# Patient Record
Sex: Female | Born: 2005 | Race: White | Hispanic: No | Marital: Single | Smoking: Never smoker
Health system: Southern US, Community
[De-identification: ages and names within clinical notes are randomized; demographics above are authoritative.]

## PROBLEM LIST (undated history)

## (undated) DIAGNOSIS — F419 Anxiety disorder, unspecified: Secondary | ICD-10-CM

---
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
## 2023-05-02 ENCOUNTER — Ambulatory Visit (HOSPITAL_COMMUNITY)
Admission: EM | Admit: 2023-05-02 | Discharge: 2023-05-02 | Disposition: A | Discharge status: Home / Self Care | Attending: Psychiatry | Admitting: Psychiatry

## 2023-05-02 DIAGNOSIS — F331 Major depressive disorder, recurrent, moderate: Secondary | ICD-10-CM

## 2023-05-02 MED ORDER — BUPROPION HCL ER (SR) 100 MG PO TB12: 100.0000 mg | ORAL_TABLET | Freq: Every day | ORAL | 0 refills | Status: DC

## 2023-05-02 MED ORDER — SERTRALINE HCL 100 MG PO TABS: 150.0000 mg | ORAL_TABLET | Freq: Every day | ORAL | Status: DC

## 2023-05-02 NOTE — ED Notes (Signed)
 Pt discharged and escorted out of facility by provider

## 2023-05-02 NOTE — Discharge Instructions (Addendum)
 It is imperative that you follow through with treatment recommendations within 5-7 days from the day of discharge to mitigate further risk to your safety and overall mental well-being.  A list of outpatient therapy and psychiatric providers for medication management has been provided below to get you started in finding the right provider for you.            [facility] [address] [place], [place], [address] [phone] phone (Medicare, Private insurance except Tricare, [place] Adwolf, and [facility])  [facility] [address] [place], [place], [address] [address] phone ([address], AmeriHealth Car[facility] - [place], [address], [place], [place], Friday Health Plans, [address], BCBS Healthy [place], [place], [address], [place], [place], [place], [place], Tricare, [facility], Safeco Corporation, Eli Lilly and Company)  [employer] [phone] [address], Suite [place], [place], [address] [phone] phone [phone] phone [phone] fax  [facility] [address] [place], [place], [address] [phone] phone (Call to confirm insurance coverage) Consultation & Support Services     o Drop-In Hours: 1:00 PM to 5:00 PM     o Days: Monday - Thursday  Crisis Services (24/7)   [facility] [address] [place], [place], [address] [phone] phone ([address] [place], [place], [place] Medicaid, [place] and [place], [facility])      [facility] [address] [place], [place], [address] [phone] phone [email or web address]  (to complete the intake form and upload ID and insurance cards)  [facility] [address] [place], [place], [address] [phone] phone ([address], [address], [place], [address] [employer], [place], [place], [place], [facility], [place], and certain Medicaid plans)  [facility] [phone] [address] [place], [place], [address] [phone] phone [phone] fax (Medicaid, Medicare, Self-pay, call about other insurance coverage)  [facility] (age 85+) [address] [place], [place], [address] [phone] hone [phone] fax ([place], [address], [place], [place], [place], [address], [place], [place], [place], [place], certain [employer], [facility], UMR)  [employer] [address] [place], [place], [address] [phone] phone (Medicare, Medicaid, Artemio Aly, call about other insurance coverage)  [facility] [address] [place], [place], [address] [phone] phone [phone] fax (Call [phone] to see what insurance is accepted) Archer Asa, MD specializes in geropsych)  [facility], [facility]  (medication management only) [address] [place], [place], [address] [phone] phone [phone] fax ([address], Medicaid, [place], [place], [place], [place], [place], [place], [place])  [facility] (medication management only) [address] [place], [place], [address] [phone] phone [phone] fax ([address] Medicare, [place], [place], Tricare [place])  [facility] [address] [place], [place], [address] [phone] phone [phone] fax ([address], Tea Collums, [place], [place], [place], [facility], [facility] Medicaid/[place] Health Choice)  [facility] [place], [employer]. [address] [place], [place], [address] [phone] phone [phone] fax (Medicare, Medicaid, [facility])  [facility] [address] [place], [place] [address] [phone] phone ([address], [place], [place], [place], [place], Medicare, Ages, [facility]) Does genetic testing for medications; does transcranial magnetic stimulation along with basic services)  [facility] [address] [place], [place],  [address] [phone] phone (Call about insurance coverage)  [facility] [address] [place], [place], [address] [phone] phone [phone] fax (Call about insurance coverage)  [facility] [address] [place], [place], [address] [phone] phone [phone]  fax (Call about insurance coverage)  [employer] [phone] [address], [place], [address] [phone] phone (Medicaid, Tricare, [place], [place], [place])  [employer] [address] [place], [place], [address] [phone] phone (Medicaid, Medicare, call about other insurance coverage)  [facility] [address] [place], [place], [address] [phone] phone [phone] fax (Medicaid, Medicare, Tricare, call about other insurance coverage)  [facility] [address] [place], [address] [phone] phone ([address], [place], [place], [place], [place] types - Alliance, Secretary/administrator, Partners, Central, [place] Health Choice, Healthy [place], [place], [place], and Complete)  [facility] [address] [place], [place], [address] [phone] phone Completely online treatment platform Contact: Personal assistant - [employer] Specialist [phone] phone [phone] fax ([address], [place], [place], Friday Health Plan, [place], [place], [place], [place], [place], [place])

## 2023-05-02 NOTE — Progress Notes (Signed)
   05/02/23 1341  [facility] Triage Screening (Walk-ins at [facility] only)  How Did You Hear About Us ? Family/Friend  What Is the Reason for Your Visit/Call Today? Vanessa Bray is a 18 year old female presenting to [facility] accompanied by her mother. Pt reports she is very anxious and has ongoing depression. Pt is diagnosed with GAD at this time. Pt reports she has cut herself multiple times, last time being thursday. Pt denies suicidal thoughts at this time. Pts mother reports she has been in bed for days and her daugther is tired of faking being happy. Pt reports school is very triggering for her, but she finds her friends to help her feel happy again. Pt is wanting a consistent therapy. Pt is currentlying taking medication (setraline). Pt reports she has feelings of being of burden daily. Pt denies substance use, Hi and Avh.  How Long Has This Been Causing You Problems? <Week  Have You Recently Had Any Thoughts About Hurting Yourself? No  Are You Planning to Commit Suicide/Harm Yourself At This time? No  Have you Recently Had Thoughts About Hurting Someone Marigene Shoulder? No  Are You Planning To Harm Someone At This Time? No  Physical Abuse Denies  Verbal Abuse Denies  Sexual Abuse Denies  Exploitation of patient/patient's resources Denies  Self-Neglect Denies  Possible abuse reported to: Other (Comment)  Are you currently experiencing any auditory, visual or other hallucinations? No  Have You Used Any Alcohol or Drugs in the Past 24 Hours? No  Do you have any current medical co-morbidities that require immediate attention? No  Clinician description of patient physical appearance/behavior: calm, cooperative  What Do You Feel Would Help You the Most Today? Medication(s);Treatment for Depression or other mood problem  If access to [facility] was not available, would you have sought care in the Emergency Department? No  Determination of Need Routine (7 days)  Options For Referral Medication Management;Intensive  Outpatient Therapy

## 2023-05-02 NOTE — ED Provider Notes (Signed)
 Behavioral Health Urgent Care Medical Screening Exam  Patient Name: Vanessa Bray MRN: [id number] Date of Evaluation: 05/02/23 Chief Complaint:  Increased depression and seeking therapy Diagnosis:  Final diagnoses:  MDD (major depressive disorder), recurrent episode, moderate (HCC)    History of Present illness: Vanessa Bray is a 18 y.o. female patient presented to [facility] as a walk in  accompanied by her mother increased depression and seeking therapy  Paulo Bosworth, 18 y.o., female patient seen face to face by this provider, consulted with Dr.Bathea; and chart reviewed on 05/02/23.  Patient reports a past psychiatric history of anxiety.  Reports she was placed on sertraline in the fifth grade due to her anxiety.  She believes she is depressed but is unsure if she has been diagnosed with depression.  She is prescribed sertraline 150 mg daily by her PCP.  She has no psychiatric services in place.  She endorses occasional alcohol use and denies all other substance use.  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  She normally has had a difficult time adjusting to college life.  She is passing in all of her classes except for physiology which she needs for her nursing major.  She reports around 3 months ago she noticed a decline in her depression.  She has had low motivation, decreased focus, feelings of helplessness at times, tearfulness,decreased focus, irritability, decreased appetite and increased sleep.  She is sleeping roughly 10-12 hours/day.  She eventually confided into her mother and patient did move back home roughly 1 month ago.  Over the past couple weeks she has begun to experience some passive thoughts of death.  She has had a few thoughts of "what if I went to sleep and just did not wake up".  She has only had 1 incident a few weeks ago where she had suicidal  thoughts and considered taking medications.  However she confided in her friend and thought of all of her protective factors and she did not follow through.  She identifies her family, friends and future plans as her motivating factors to live.  She has also expressed these thoughts to her mother.  They contacted her PCP and they could not see her for medication management and they referred her to [facility].  Patient is also interested in consistent therapy.  She has seen a therapist at [facility] counseling department roughly 2 times per month but lately due to the influx of students needing services she is having to wait a month at a time to get an appointment.  She presents today requesting medication management and therapy.  During evaluation ALYCE INSCORE is observed sitting in the assessment room in no acute distress.  She is alert/oriented x 4, calm, cooperative, and attentive.  She has normal speech and behavior.  She has a depressed affect.  She is denying any current suicidal thoughts or thoughts of death.  She denies any intent, plan, or access to means.  She has no access to firearms/weapons in the home.  She verbally contracts for safety.  She denies homicidal ideations  Objectively there is no evidence of psychosis/mania, paranoia or delusional thinking.  Patient is able to converse coherently, goal directed thoughts, no distractibility, or pre-occupation.  Patient answered question appropriately.    Mother has no immediate safety concerns with patient returning home.  Discussed safety planning/suicide intervention with  patient and her mother.  Mother agrees to move any item of concern out of patient's access.  This includes OTC medications/prescription medications and knives/sharps. She is also educated and verbalizes understanding of mental health resources and other crisis services in the community. She is instructed to call 911 and present to the nearest emergency room should She experience any  suicidal/homicidal ideation, auditory/visual/hallucinations, or detrimental worsening of her mental health condition.   Discussed inpatient psychiatric admission and patient declined.  Also discussed PHP/IOP program and patient declined due to her school schedule.  In addition she does not want to participate in any type of group programs she is interested in one-to-one therapy only.  Provided outpatient psychiatric resources for medication management and therapy.  Also discussed augmenting patient's current sertraline 150 mg daily with Wellbutrin SR 100 mg once daily in AM.  Explained medication and any adverse reactions.  Patient verbalized understanding and is in agreement to try medications.  Patient was only provided with a 2-week supply.  She is instructed to follow-up with her PCP.     Flowsheet Row ED from 05/02/2023 in [facility] ED from 10/05/2022 in [facility]   C-SSRS RISK CATEGORY No Risk No Risk       Psychiatric Specialty Exam  Presentation  General Appearance:Appropriate for Environment; Casual  Eye Contact:Good  Speech:Clear and Coherent; Normal Rate  Speech Volume:Normal  Handedness:Right   Mood and Affect  Mood: Anxious; Depressed  Affect: Congruent   Thought Process  Thought Processes: Coherent  Descriptions of Associations:Intact  Orientation:Full (Time, Place and Person)  Thought Content:Logical    Hallucinations:None  Ideas of Reference:None  Suicidal Thoughts:No  Homicidal Thoughts:No   Sensorium  Memory: Immediate Good; Recent Good; Remote Good  Judgment: Good  Insight: Good   Executive Functions  Concentration: Good  Attention Span: Good  Recall: Good  Fund of Knowledge: Good  Language: Good   Psychomotor Activity  Psychomotor Activity: Normal   Assets  Assets: Communication Skills; Desire for Improvement; Financial Resources/Insurance; Leisure Time;  Physical Health; Resilience; Housing; Vocational/Educational   Sleep  Sleep: Poor  Number of hours:  12   Physical Exam: Physical Exam Constitutional:      Appearance: Normal appearance.  Eyes:     General:        Right eye: No discharge.        Left eye: No discharge.  Cardiovascular:     Rate and Rhythm: Normal rate.  Pulmonary:     Effort: Pulmonary effort is normal. No respiratory distress.  Musculoskeletal:        General: Normal range of motion.     Cervical back: Normal range of motion.  Skin:    Coloration: Skin is not jaundiced or pale.  Neurological:     Mental Status: She is alert and oriented to person, place, and time.  Psychiatric:        Attention and Perception: Attention and perception normal.        Mood and Affect: Mood is anxious and depressed.        Speech: Speech normal.        Behavior: Behavior normal. Behavior is cooperative.        Thought Content: Thought content normal.        Cognition and Memory: Cognition normal.        Judgment: Judgment normal.    Review of Systems  Constitutional:  Negative for chills and fever.  HENT:  Negative for hearing loss.   Respiratory:  Negative for cough and shortness of breath.   Cardiovascular:  Negative for chest pain.  Gastrointestinal:  Negative for nausea and vomiting.  Musculoskeletal: Negative.   Neurological:  Negative for tremors.  Psychiatric/Behavioral:  Positive for depression. The patient is nervous/anxious.    Blood pressure 130/83, pulse 73, resp. rate 19, SpO2 99%. There is no height or weight on file to calculate BMI.  Musculoskeletal: Strength & Muscle Tone: within normal limits Gait & Station: normal Patient leans: N/A   Demographic Factors:  Adolescent or young adult and Caucasian  Loss Factors: NA  Historical Factors: NA  Risk Reduction Factors:   Sense of responsibility to family, Living with another person, especially a relative, Positive social support, Positive  therapeutic relationship, and Positive coping skills or problem solving skills  Continued Clinical Symptoms:  Severe Anxiety and irritability  Depression:  Helplessness at times   Cognitive Features That Contribute To Risk:  None    Suicide Risk:  Minimal: No identifiable suicidal ideation.  Patients presenting with no risk factors but with mild thoughts of death such as "going to sleep and not waking up" may be classified as minimal risk based on the severity of the depressive symptoms  Plan Of Care/Follow-up recommendations:  Activity:  as tolerated  Diet:  regular    [facility] MSE Discharge Disposition for Follow up and Recommendations: Based on my evaluation the patient does not appear to have an emergency medical condition and can be discharged with resources and follow up care in outpatient services for Medication Management, Partial Hospitalization Program, and Individual Therapy  Discharge patient  Provided outpatient psychiatric resources for medication management and therapy  Provided prescription for bupropion SR 100 mg daily in a.m. for 2-week supply  Suicide intervention/safety planning completed   Costella Dirks, NP 05/02/2023, 3:58 PM

## 2023-07-14 ENCOUNTER — Other Ambulatory Visit: Payer: Self-pay

## 2023-07-14 ENCOUNTER — Ambulatory Visit: Admitting: Psychiatry

## 2023-07-14 ENCOUNTER — Encounter: Payer: Self-pay | Admitting: Psychiatry

## 2023-07-14 VITALS — BP 131/85 | HR 81 | Temp 97.2°F | Ht 62.0 in | Wt 183.2 lb

## 2023-07-14 DIAGNOSIS — F324 Major depressive disorder, single episode, in partial remission: Secondary | ICD-10-CM | POA: Diagnosis not present

## 2023-07-14 DIAGNOSIS — F411 Generalized anxiety disorder: Secondary | ICD-10-CM | POA: Diagnosis not present

## 2023-07-14 DIAGNOSIS — Z79899 Other long term (current) drug therapy: Secondary | ICD-10-CM

## 2023-07-14 DIAGNOSIS — R41840 Attention and concentration deficit: Secondary | ICD-10-CM | POA: Insufficient documentation

## 2023-07-14 NOTE — Patient Instructions (Signed)
  www.[email or web address].org  www.psychologytoday  [email or web address] [facility] [address], [place], [place] [address]   [phone]  [facility] [email or web address] [address] [place], [place] [address]  [phone]  [facility], [facility] [email or web address] [address], [place], [place] [address]  [phone]   Family solutions - [phone]  [facility] - [phone]  [facility] Life counseling - [phone] counseling [phone]  [facility] - [phone]   [facility] [address] Suite [place], [place] [address]  [phone]    Belvie Chancy [address] [place], [place] [address]  [phone]    [facility] [address] [place], [place] [address]  [phone]

## 2023-07-14 NOTE — Progress Notes (Deleted)
 error

## 2023-07-14 NOTE — Progress Notes (Signed)
 Psychiatric Initial Adult Assessment   Patient Identification: Vanessa Bray MRN:  [id number] Date of Evaluation:  07/14/2023 Referral Source: Talitha Service MD Chief Complaint:   Chief Complaint  Patient presents with   Establish Care   Anxiety   Medication Refill   Depression   Visit Diagnosis:    ICD-10-CM   1. Major depressive disorder with single episode, in partial remission (HCC)  F32.4     2. GAD (generalized anxiety disorder)  F41.1 TSH    3. Attention and concentration deficit  R41.840 TSH    4. High risk medication use  Z79.899 TSH      Discussed the use of AI scribe software for clinical note transcription with the patient, who gave verbal consent to proceed.  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 for a psychiatric evaluation.  Her mood symptoms began after failing a physiology class, leading to feelings of being 'down' and having high expectations for herself. She experiences symptoms of depression, including not wanting to get out of bed, poor appetite, and low self-esteem. These symptoms have improved since her visit to the behavioral health center May 02, 2023, but she still has days where she feels very low and prefers to stay in bed. In the past two weeks, she had one or two such days, but she was also at [employer], which she enjoyed.  She has a poor appetite, eating only once a day, and describes not feeling the need to eat. Her daily intake includes goldfish and chips and salsa. Despite decreased appetite, she ensures she eats something daily. She has lost about three to four pounds in the past three months.  She is also on Wellbutrin  and unknown if that could be contributing to her appetite  suppression.  She has a history of passive suicidality stating 'I wish I wouldn't wake up sometimes,' but denies any intent to act on them. The last occurrence was about a month ago. She has a history of self-injurious behavior, having cut herself with a razor blade about two months ago to 'feel something.' Her parents are aware of this behavior.  Currently denies any suicidality, homicidality or perceptual disturbances.  She does call herself a Product/process development scientist, worries about everything to the extreme and most recently about her academic problems.  She is often nervous, restless fidgety and has trouble relaxing.  This has been going on since the past several months however reports the current medication combination as beneficial.  She describes herself as socially anxious, feeling anxious in new situations and worried about being judged or not fitting in. She avoids new experiences due to this anxiety but manages to participate in necessary activities despite her discomfort.   She has been on sertraline  150 mg and Wellbutrin  100 mg for two months, which have improved her anxiety symptoms. She notes a possible link between Wellbutrin  and her decreased appetite.  She enjoys activities like going to the beach and spending time with friends, which help improve her mood.  She reports symptoms consistent with attention difficulties, such as trouble focusing, organizing tasks, and remembering appointments. These issues have been present since middle school, around age 65.  She is interested in a referral for ADHD testing.  She denies any manic or  hypomanic symptoms.  She denies any obsessions or compulsive behaviors.  She denies any history of trauma or PTSD symptoms.    Associated Signs/Symptoms: Depression Symptoms:  depressed mood, anhedonia, insomnia, hypersomnia, fatigue, feelings of worthlessness/guilt, difficulty concentrating, decreased appetite, (Hypo) Manic Symptoms:  Denies Anxiety  Symptoms:  Excessive Worry, Social Anxiety, Psychotic Symptoms:  Denies PTSD Symptoms: Negative  Past Psychiatric History: Denies inpatient behavioral health admissions.  However she was evaluated at [facility] on May 02, 2023 for worsening depression and anxiety.  Denies suicidality.  Does report self-injurious behaviors of cutting, once, 2 months ago.  She was initiated on antidepressants at her most recent urgent care visit which was in April.  Previous Psychotropic Medications: Yes sertraline , Wellbutrin   Substance Abuse History in the last 12 months:  No.  Consequences of Substance Abuse: Negative  Past Medical History:  Past Medical History:  Diagnosis Date   Anxiety    History reviewed. No pertinent surgical history.  Family Psychiatric History: As noted below.  Family History:  Family History  Problem Relation Age of Onset   Healthy Mother    Depression Father    Anxiety disorder Father    Depression Paternal Grandmother    Anxiety disorder Paternal Grandmother    Suicidality Other     Social History:   Social History   Socioeconomic History   Marital status: Single    Spouse name: Not on file   Number of children: Not on file   Years of education: Not on file   Highest education level: High school graduate  Occupational History   Not on file  Tobacco Use   Smoking status: Passive Smoke Exposure - Never Smoker   Smokeless tobacco: Never  Vaping Use   Vaping status: Never Used  Substance and Sexual Activity   Alcohol use: Never   Drug use: Never   Sexual activity: Yes    Birth control/protection: Pill, Condom  Other Topics Concern   Not on file  Social History Narrative   Not on file   Social Drivers of Health   Financial Resource Strain: Not on file  Food Insecurity: Not on file  Transportation Needs: Not on file  Physical Activity: Not on file  Stress: Not on file  Social Connections: Not on file    Additional Social History: She was born  in [place].  She had a great childhood.  Raised by both parents.  One older brother, one younger sister.  She is certified as a Lawyer.  She is taking prenursing at [employer].  She is religious.  She denies access to gun.  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  Denies any history of trauma.  Denies legal problems.  She is single.  She currently lives with her family in [place].  Allergies:  No Known Allergies  Metabolic Disorder Labs: No results found for: HGBA1C, MPG No results found for: PROLACTIN No results found for: CHOL, TRIG, HDL, CHOLHDL, VLDL, LDLCALC No results found for: TSH  Therapeutic Level Labs: No results found for: LITHIUM No results found for: CBMZ No results found for: VALPROATE  Current Medications: Current Outpatient Medications  Medication Sig Dispense Refill   buPROPion  ER (WELLBUTRIN  SR) 100 MG 12 hr tablet Take 1 tablet (100 mg total) by mouth daily. 14 tablet 0   norgestimate-ethinyl estradiol (ORTHO-CYCLEN) 0.25-35 MG-MCG tablet Take 1 tablet by mouth at bedtime.     sertraline  (ZOLOFT ) 100 MG tablet Take 1.5  tablets (150 mg total) by mouth daily.     No current facility-administered medications for this visit.    Musculoskeletal: Strength & Muscle Tone: within normal limits Gait & Station: normal Patient leans: N/A  Psychiatric Specialty Exam: Review of Systems  Psychiatric/Behavioral:  Positive for decreased concentration and dysphoric mood. The patient is nervous/anxious.     Blood pressure 131/85, pulse 81, temperature (!) 97.2 F (36.2 C), temperature source Temporal, height 5' 2 (1.575 m), weight 183 lb 3.2 oz (83.1 kg).Body mass index is 33.51 kg/m.  General Appearance: Casual  Eye Contact:  Fair  Speech:  Clear and Coherent  Volume:  Normal  Mood:  Anxious and Depressed improving  Affect:  Congruent  Thought Process:  Goal Directed and Descriptions of Associations: Intact   Orientation:  Full (Time, Place, and Person)  Thought Content:  Logical  Suicidal Thoughts:  No  Homicidal Thoughts:  No  Memory:  Immediate;   Fair Recent;   Fair Remote;   Fair  Judgement:  Fair  Insight:  Fair  Psychomotor Activity:  Normal  Concentration:  Concentration: Fair and Attention Span: Fair  Recall:  [employer] of Knowledge:Fair  Language: Fair  Akathisia:  No  Handed:  Right  AIMS (if indicated):  not done  Assets:  Communication Skills Desire for Improvement Housing Social Support Transportation  ADL's:  Intact  Cognition: WNL  Sleep:  Fair   Screenings: GAD-7    Flowsheet Row [facility] Visit from [facility]/14/2023 in [facility]  Total GAD-7 Score 9   PHQ2-9    Flowsheet Row Office Visit from 07/14/2023 in [facility]  PHQ-2 Total Score 2  PHQ-9 Total Score 7   Flowsheet Row Office Visit from 07/14/2023 in [facility] ED from 05/02/2023 in [facility] from 10/05/2022 in [facility] [place]   C-SSRS RISK CATEGORY Low Risk No Risk No Risk    Assessment and Plan: Vanessa Bray is a 18 year old Caucasian female, single, lives in [place] with her family, recent diagnosis of depression, comorbid anxiety was evaluated in office today, presented to establish care.  Discussed assessment and plan as noted below. The patient demonstrates the following risk factors for suicide: Chronic risk factors for suicide include: psychiatric disorder of depression, anxiety, previous self-harm x 1, and completed suicide in a family member. Acute risk factors for suicide include: Academic stress, uncontrolled mood symptoms. Protective factors for this patient include: positive social support, positive therapeutic relationship, coping skills, hope for the future, religious beliefs against suicide, and life satisfaction.  Considering these factors, the overall suicide risk at this point appears to be low. Patient is appropriate for outpatient follow up.  Assessment & Plan Major Depressive Disorder, single in partial remission Vanessa Bray reports improvement in mood symptoms with decreased frequency of days where she feels unable to get out of bed. Appetite remains poor with minimal food intake. She experiences occasional passive suicidality (a month ago )but denies recent suicidal ideation or attempts. Current medications, sertraline  and Wellbutrin , have been beneficial. She prefers therapy before considering an increase in medication dosage to avoid long-term reliance on medication. - Continue Sertraline  150 mg daily - Continue Wellbutrin  100 mg SR daily - Refer to therapy for ongoing support - Order thyroid function test to rule out thyroid-related mood symptoms  Generalized Anxiety Disorder-unstable Vanessa Bray experiences social anxiety and generalized worry, improved with sertraline . Anxiety in new  situations and social settings impacts her ability to engage in activities. She prefers therapy to manage anxiety rather than increasing medication at this time. - Continue Sertraline  150 mg daily - Refer to therapy for anxiety management-provided resources in the community.  Rule out attention Deficit Hyperactivity Disorder, unspecified Vanessa Bray reports symptoms consistent with ADHD, including difficulty with attention, organization, and task completion, present since middle school. Recommended testing to confirm diagnosis and advised contacting [facility] for ADHD testing due to potential quicker access. Discussed importance of addressing mood symptoms alongside potential ADHD treatment. - Refer to [facility] for ADHD testing - Consider referral to [facility] system for ADHD testing if [facility] is not feasible  High Risk medication use-ordered labs-TSH.  Patient provided lab slip to go to [facility]   Collaboration of Care: Referral or  follow-up with counselor/therapist AEB patient encouraged to establish care with therapist.  Patient encouraged to get ADHD testing I have reviewed notes per Ms. Elveria Batter NP dated 05/02/2023-patient was prescribed sertraline , Wellbutrin  and also recommended PHP/IOP.Vanessa Bray  Patient/Guardian was advised Release of Information must be obtained prior to any record release in order to collaborate their care with an outside provider. Patient/Guardian was advised if they have not already done so to contact the registration department to sign all necessary forms in order for us  to release information regarding their care.   Consent: Patient/Guardian gives verbal consent for treatment and assignment of benefits for services provided during this visit. Patient/Guardian expressed understanding and agreed to proceed.   This note was generated in part or whole with voice recognition software. Voice recognition is usually quite accurate but there are transcription errors that can and very often do occur. I apologize for any typographical errors that were not detected and corrected.     Balian Schaller, MD 6/25/202512:33 PM

## 2023-07-17 LAB — TSH: TSH: 1.38 u[IU]/mL (ref 0.450–4.500)

## 2023-07-20 ENCOUNTER — Ambulatory Visit: Payer: Self-pay | Admitting: Psychiatry

## 2023-09-16 ENCOUNTER — Ambulatory Visit: Admitting: Psychiatry

## 2023-09-16 ENCOUNTER — Encounter: Payer: Self-pay | Admitting: Psychiatry

## 2023-09-16 VITALS — BP 118/80 | HR 90 | Temp 98.6°F | Ht 62.0 in | Wt 182.2 lb

## 2023-09-16 DIAGNOSIS — R41840 Attention and concentration deficit: Secondary | ICD-10-CM

## 2023-09-16 DIAGNOSIS — F411 Generalized anxiety disorder: Secondary | ICD-10-CM

## 2023-09-16 DIAGNOSIS — F325 Major depressive disorder, single episode, in full remission: Secondary | ICD-10-CM

## 2023-09-16 MED ORDER — SERTRALINE HCL 100 MG PO TABS: 150.0000 mg | ORAL_TABLET | Freq: Every day | ORAL | 0 refills | Status: DC

## 2023-09-16 NOTE — Progress Notes (Signed)
 BH MD OP Progress Note  09/16/2023 9:25 AM Vanessa Bray  MRN:  [id number]  Chief Complaint:  Chief Complaint  Patient presents with   Follow-up   Anxiety   Medication Refill   Depression   Discussed the use of AI scribe software for clinical note transcription with the patient, who gave verbal consent to proceed.  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  She reports that work has been going well and that she has worked 2 to 3 days per week over the summer, including on a new unit where she pushed herself out of her comfort zone and learned new skills. She reports that she does not have any current concerns. With the start of the school year and increasing stress, she has experienced thoughts several days a week about not wanting to wake up, clarifying that these are not active thoughts of killing herself or feeling that she would be better off dead, but rather passive wishes related to stress.  She continues to experience ongoing attention and focus problems, and she has not yet completed ADHD testing at [facility]. She continues to take sertraline  150 mg (1.5 tablets) at night and Wellbutrin  SR 100 mg, which she reports taking once in the morning and once in the afternoon, though her prescription lists once daily dosing. She denies any concerns or side effects with her current medications.  She denies any homicidality or perceptual disturbances.  She previously engaged in therapy with a school-based therapist.  She is currently enrolled at [employer] and taking pre-nursing courses.   She recently had thyroid testing completed which came back within normal limits.   Visit Diagnosis:    ICD-10-CM   1. Major depressive disorder with single episode, in full remission (HCC)  F32.5 sertraline  (ZOLOFT )  100 MG tablet    2. GAD (generalized anxiety disorder)  F41.1     3. Attention and concentration deficit  R41.840       Past Psychiatric History: I have reviewed past psychiatric history from progress note on 07/14/2023.  Past Medical History:  Past Medical History:  Diagnosis Date   Anxiety    History reviewed. No pertinent surgical history.  Family Psychiatric History: I have reviewed family psychiatric history from progress note on 07/14/2023.  Family History:  Family History  Problem Relation Age of Onset   Healthy Mother    Depression Father    Anxiety disorder Father    Depression Paternal Grandmother    Anxiety disorder Paternal Grandmother    Suicidality Other     Social History: I have reviewed social history from progress note on 07/14/2023. Social History   Socioeconomic History   Marital status: Single    Spouse name: Not on file   Number of children: Not on file   Years of education: Not on file   Highest education level: High school graduate  Occupational History   Not on file  Tobacco Use   Smoking status: Never    Passive exposure: Yes   Smokeless tobacco: Never  Vaping Use   Vaping status: Never Used  Substance and Sexual Activity   Alcohol use: Never   Drug use: Never   Sexual activity: Yes    Birth control/protection: Pill, Condom  Other Topics Concern   Not on file  Social History  Narrative   Not on file   Social Drivers of Health   Financial Resource Strain: Not on file  Food Insecurity: Not on file  Transportation Needs: Not on file  Physical Activity: Not on file  Stress: Not on file  Social Connections: Not on file    Allergies: No Known Allergies  Metabolic Disorder Labs: No results found for: HGBA1C, MPG No results found for: PROLACTIN No results found for: CHOL, TRIG, HDL, CHOLHDL, VLDL, LDLCALC Lab Results  Component Value Date   TSH 1.380 07/16/2023    Therapeutic Level Labs: No results found  for: LITHIUM No results found for: VALPROATE No results found for: CBMZ  Current Medications: Current Outpatient Medications  Medication Sig Dispense Refill   buPROPion  ER (WELLBUTRIN  SR) 100 MG 12 hr tablet Take 1 tablet (100 mg total) by mouth daily. 14 tablet 0   norgestimate-ethinyl estradiol (ORTHO-CYCLEN) 0.25-35 MG-MCG tablet Take 1 tablet by mouth at bedtime.     sertraline  (ZOLOFT ) 100 MG tablet Take 1.5 tablets (150 mg total) by mouth daily. 135 tablet 0   No current facility-administered medications for this visit.     Musculoskeletal: Strength & Muscle Tone: within normal limits Gait & Station: normal Patient leans: N/A  Psychiatric Specialty Exam: Review of Systems  Psychiatric/Behavioral: Negative.      Blood pressure 118/80, pulse 90, temperature 98.6 F (37 C), temperature source Temporal, height 5' 2 (1.575 m), weight 182 lb 3.2 oz (82.6 kg), last menstrual period 09/04/2023, SpO2 94%.Body mass index is 33.32 kg/m.  General Appearance: Casual  Eye Contact:  Fair  Speech:  Clear and Coherent  Volume:  Normal  Mood:  Euthymic  Affect:  Appropriate  Thought Process:  Goal Directed and Descriptions of Associations: Intact  Orientation:  Full (Time, Place, and Person)  Thought Content: Logical   Suicidal Thoughts:  No  Homicidal Thoughts:  No  Memory:  Immediate;   Fair Recent;   Fair Remote;   Fair  Judgement:  Fair  Insight:  Fair  Psychomotor Activity:  Normal  Concentration:  Concentration: Fair and Attention Span: Fair  Recall:  [employer] of Knowledge: Fair  Language: Fair  Akathisia:  No  Handed:  Right  AIMS (if indicated): not done  Assets:  Manufacturing Systems Engineer Desire for Improvement Housing Social Support Transportation  ADL's:  Intact  Cognition: WNL  Sleep:  Fair   Screenings: GAD-7    Garment/textile Technologist Visit from 09/16/2023 in [facility] Office Visit from 07/14/2023 in [facility]  Total GAD-7 Score 11 9   PHQ2-9    Flowsheet Row Office Visit from 09/16/2023 in [facility] Office Visit from 07/14/2023 in [facility]  PHQ-2 Total Score 2 2  PHQ-9 Total Score 10 7   Flowsheet Row Office Visit from 09/16/2023 in [facility] Office Visit from 07/14/2023 in [facility] ED from 05/02/2023 in [facility]  C-SSRS RISK CATEGORY Low Risk Low Risk No Risk     Assessment and Plan: DAJAI WAHLERT is a 18 year old Caucasian female, single, lives in [place], has a history of depression, anxiety was evaluated in office today.  Discussed assessment and plan as noted below.  MDD in remission Currently denies any significant depression symptoms. Continue Sertraline  150 mg daily Continue Wellbutrin  SR 100 mg as prescribed.  Patient to verify her  dosage and let this provider know.  Generalized anxiety disorder-improving Currently reports anxiety symptoms as manageable.  Motivated to start psychotherapy. Continue Sertraline  150 mg daily Patient encouraged to reestablish care with previous therapist.  Attention and concentration deficit, rule out ADHD-patient agrees to get testing completed at [facility]  Reviewed and discussed labs-dated 07/16/2023-TSH-1.380-within normal limits.  Follow-up Follow-up in clinic in 3 months or sooner if needed.   Consent: Patient/Guardian gives verbal consent for treatment and assignment of benefits for services provided during this visit. Patient/Guardian expressed understanding and agreed to proceed.   This note was generated in part or whole with voice recognition software. Voice recognition is usually quite accurate but there are transcription errors that can and very often do occur. I apologize for any typographical  errors that were not detected and corrected.    Jermarion Poffenberger, MD 09/17/2023, 6:32 AM

## 2023-09-17 ENCOUNTER — Telehealth: Payer: Self-pay

## 2023-09-17 NOTE — Telephone Encounter (Signed)
 pt called states that she wasn't able to send you a mychart message but that she is taking the wellbutrin  100mg  1 a day. pt was seen yesterday and next appt is 11-26

## 2023-09-17 NOTE — Telephone Encounter (Signed)
 Noted

## 2023-12-14 ENCOUNTER — Encounter: Payer: Self-pay | Admitting: Psychiatry

## 2023-12-14 ENCOUNTER — Telehealth (INDEPENDENT_AMBULATORY_CARE_PROVIDER_SITE_OTHER): Admitting: Psychiatry

## 2023-12-14 DIAGNOSIS — R41840 Attention and concentration deficit: Secondary | ICD-10-CM | POA: Diagnosis not present

## 2023-12-14 DIAGNOSIS — F325 Major depressive disorder, single episode, in full remission: Secondary | ICD-10-CM | POA: Diagnosis not present

## 2023-12-14 DIAGNOSIS — F411 Generalized anxiety disorder: Secondary | ICD-10-CM

## 2023-12-14 MED ORDER — SERTRALINE HCL 100 MG PO TABS: 150.0000 mg | ORAL_TABLET | Freq: Every day | ORAL | 1 refills | Status: AC

## 2023-12-14 NOTE — Progress Notes (Signed)
 Virtual Visit via Video Note  I connected with Vanessa Bray on 12/14/23 at  9:00 AM EST by a video enabled telemedicine application and verified that I am speaking with the correct person using two identifiers.  Location Provider Location : [facility] Patient Location : Home  Participants: Patient , Provider   I discussed the limitations of evaluation and management by telemedicine and the availability of in person appointments. The patient expressed understanding and agreed to proceed.   I discussed the assessment and treatment plan with the patient. The patient was provided an opportunity to ask questions and all were answered. The patient agreed with the plan and demonstrated an understanding of the instructions.   The patient was advised to call back or seek an in-person evaluation if the symptoms worsen or if the condition fails to improve as anticipated.   BH MD OP Progress Note  12/14/2023 9:14 AM JACQUALYN SEDGWICK  MRN:  [id number]  Chief Complaint:  Chief Complaint  Patient presents with   Medication Refill   Follow-up   Anxiety   Depression   Discussed the use of AI scribe software for clinical note transcription with the patient, who gave verbal consent to proceed.  History of Present Illness Vanessa Bray is a 18 year old patient female who is single, consulting civil engineer at [employer], lives in [place] with her family, has a history of depression, anxiety was evaluated by telemedicine today.  She reports that she has been doing pretty well since her last visit in August. She experiences occasional moments of anxiety, primarily related to academic stress such as tests, but these occur infrequently and do not significantly impact her functioning. Over the past 2 weeks, she denies experiencing significant depression, sadness, hopelessness, or loss of interest in activities.  Approximately 3 months ago, she stopped taking Wellbutrin  SR 100 mg once daily and she has not noticed any changes in  mood, attention, or focus since she discontinued the medication.  She is compliant on the sertraline .  Denies side effects  She currently attends college and recently changed her major to psychology with a minor in human development and family studies. She is currently employed.  She reports working school is going well.   Visit Diagnosis:    ICD-10-CM   1. Major depressive disorder with single episode, in full remission  F32.5 sertraline  (ZOLOFT ) 100 MG tablet    2. GAD (generalized anxiety disorder)  F41.1     3. Attention and concentration deficit  R41.840       Past Psychiatric History: I have reviewed past psychiatric history from progress note on 07/14/2023.  Past trials of Wellbutrin .  Past Medical History:  Past Medical History:  Diagnosis Date   Anxiety    History reviewed. No pertinent surgical history.  Family Psychiatric History: I have reviewed family psychiatric history from progress note on 07/14/2023.  Family History:  Family History  Problem Relation Age of Onset   Healthy Mother    Depression Father    Anxiety disorder Father    Depression Paternal Grandmother    Anxiety disorder Paternal Grandmother    Suicidality Other     Social History: I have reviewed social history from progress note on 07/14/2023. Social History   Socioeconomic History   Marital status: Single    Spouse name: Not on file   Number of children: Not on file   Years of education: Not on file   Highest education level: High school graduate  Occupational History   Not  on file  Tobacco Use   Smoking status: Never    Passive exposure: Yes   Smokeless tobacco: Never  Vaping Use   Vaping status: Never Used  Substance and Sexual Activity   Alcohol use: Never   Drug use: Never   Sexual activity: Yes    Birth control/protection: Pill, Condom  Other Topics Concern   Not on file  Social History Narrative   Not on file   Social Drivers of Health   Financial Resource Strain: Not  on file  Food Insecurity: Not on file  Transportation Needs: Not on file  Physical Activity: Not on file  Stress: Not on file  Social Connections: Not on file    Allergies: No Known Allergies  Metabolic Disorder Labs: No results found for: HGBA1C, MPG No results found for: PROLACTIN No results found for: CHOL, TRIG, HDL, CHOLHDL, VLDL, LDLCALC Lab Results  Component Value Date   TSH 1.380 07/16/2023    Therapeutic Level Labs: No results found for: LITHIUM No results found for: VALPROATE No results found for: CBMZ  Current Medications: Current Outpatient Medications  Medication Sig Dispense Refill   norgestimate-ethinyl estradiol (ORTHO-CYCLEN) 0.25-35 MG-MCG tablet Take 1 tablet by mouth at bedtime.     sertraline  (ZOLOFT ) 100 MG tablet Take 1.5 tablets (150 mg total) by mouth daily. 135 tablet 1   No current facility-administered medications for this visit.     Musculoskeletal: Strength & Muscle Tone: UTA Gait & Station: Seated Patient leans: N/A  Psychiatric Specialty Exam: Review of Systems  Psychiatric/Behavioral: Negative.      There were no vitals taken for this visit.There is no height or weight on file to calculate BMI.  General Appearance: Casual  Eye Contact:  Fair  Speech:  Clear and Coherent  Volume:  Normal  Mood:  Euthymic  Affect:  Full Range  Thought Process:  Goal Directed and Descriptions of Associations: Intact  Orientation:  Full (Time, Place, and Person)  Thought Content: Logical   Suicidal Thoughts:  No  Homicidal Thoughts:  No  Memory:  Immediate;   Fair Recent;   Fair Remote;   Fair  Judgement:  Fair  Insight:  Fair  Psychomotor Activity:  Normal  Concentration:  Concentration: Fair and Attention Span: Fair  Recall:  [employer] of Knowledge: Fair  Language: Fair  Akathisia:  No  Handed:  Right  AIMS (if indicated): not done  Assets:  Communication Skills Desire for Improvement Housing Social  Support Talents/Skills Transportation  ADL's:  Intact  Cognition: WNL  Sleep:  Fair   Screenings: GAD-7    Garment/textile Technologist Visit from 09/16/2023 in [facility] Office Visit from 07/14/2023 in [facility]  Total GAD-7 Score 11 9   PHQ2-9    Flowsheet Row Video Visit from 12/14/2023 in [facility] Office Visit from 09/16/2023 in [facility] Office Visit from 07/14/2023 in [facility]  PHQ-2 Total Score 0 2 2  PHQ-9 Total Score -- 10 7   Flowsheet Row Video Visit from 12/14/2023 in [facility] Office Visit from 09/16/2023 in [facility] Office Visit from 07/14/2023 in [facility]  C-SSRS RISK CATEGORY No Risk Low Risk Low Risk     Assessment and Plan: TONAE LIVOLSI is a 18 year old Caucasian female who presented for a follow-up appointment, discussed  assessment and plan as noted below.  1. Major depressive disorder with single episode, in full remission Currently denies any significant depression symptoms Continue Sertraline  150 mg daily Discontinue Wellbutrin  for noncompliance.  2. GAD (generalized anxiety disorder)-stable Denies any current anxiety symptoms Continue Sertraline  150 mg daily   3. Attention and concentration deficit-rule out ADHD patient was referred for testing at [facility].  Pending.  Follow-up Follow-up in clinic in 3 months or sooner if needed.   Consent: Patient/Guardian gives verbal consent for treatment and assignment of benefits for services provided during this visit. Patient/Guardian expressed understanding and agreed to proceed.  This note was generated in part or whole with voice recognition software. Voice recognition is usually  quite accurate but there are transcription errors that can and very often do occur. I apologize for any typographical errors that were not detected and corrected.     Mehul Rudin, MD 12/14/2023, 9:14 AM

## 2024-03-16 ENCOUNTER — Ambulatory Visit: Admitting: Psychiatry
